# Patient Record
Sex: Male | Born: 1995 | Race: White | Hispanic: No | Marital: Single | Smoking: Former smoker
Health system: Southern US, Community
[De-identification: ages and names within clinical notes are randomized; demographics above are authoritative.]

## PROBLEM LIST (undated history)

## (undated) DIAGNOSIS — Z789 Other specified health status: Secondary | ICD-10-CM

## (undated) HISTORY — PX: NO PAST SURGERIES: SHX2092

## (undated) HISTORY — DX: Other specified health status: Z78.9

---

## 2018-12-28 ENCOUNTER — Encounter: Payer: Self-pay | Admitting: Sports Medicine

## 2018-12-28 ENCOUNTER — Ambulatory Visit (INDEPENDENT_AMBULATORY_CARE_PROVIDER_SITE_OTHER): Payer: BLUE CROSS/BLUE SHIELD

## 2018-12-28 ENCOUNTER — Other Ambulatory Visit: Payer: Self-pay

## 2018-12-28 ENCOUNTER — Ambulatory Visit (INDEPENDENT_AMBULATORY_CARE_PROVIDER_SITE_OTHER): Payer: BLUE CROSS/BLUE SHIELD | Admitting: Sports Medicine

## 2018-12-28 DIAGNOSIS — M25561 Pain in right knee: Secondary | ICD-10-CM | POA: Insufficient documentation

## 2018-12-28 DIAGNOSIS — M25562 Pain in left knee: Secondary | ICD-10-CM

## 2018-12-28 MED ORDER — MELOXICAM 15 MG PO TABS: ORAL_TABLET | ORAL | 3 refills | Status: DC

## 2018-12-28 NOTE — Assessment & Plan Note (Signed)
Benign exam, good hip abductor strength. X-rays, meloxicam, formal physical therapy. Return to see me in 4 to 6 weeks, MRI versus injection if no better.

## 2018-12-28 NOTE — Progress Notes (Signed)
Subjective:    CC: Left knee pain  HPI:  This is a pleasant 23 year old male, for the past few months he is had pain that he localizes in his left knee, anterior, worse when sitting for long periods of time, get stiff.  Does not really have much pain going Down stairs, no mechanical symptoms, no trauma, no swelling.  Symptoms are moderate, persistent, localized without radiation.  I reviewed the past medical history, family history, social history, surgical history, and allergies today and no changes were needed.  Please see the problem list section below in epic for further details.  Past Medical History: Past Medical History:  Diagnosis Date  . No pertinent past medical history    Past Surgical History: History reviewed. No pertinent surgical history. Social History: Social History   Socioeconomic History  . Marital status: Not on file    Spouse name: Not on file  . Number of children: Not on file  . Years of education: Not on file  . Highest education level: Not on file  Occupational History  . Not on file  Social Needs  . Financial resource strain: Not on file  . Food insecurity:    Worry: Not on file    Inability: Not on file  . Transportation needs:    Medical: Not on file    Non-medical: Not on file  Tobacco Use  . Smoking status: Former Games developer  . Smokeless tobacco: Never Used  Substance and Sexual Activity  . Alcohol use: Yes  . Drug use: Not on file  . Sexual activity: Not on file  Lifestyle  . Physical activity:    Days per week: Not on file    Minutes per session: Not on file  . Stress: Not on file  Relationships  . Social connections:    Talks on phone: Not on file    Gets together: Not on file    Attends religious service: Not on file    Active member of club or organization: Not on file    Attends meetings of clubs or organizations: Not on file    Relationship status: Not on file  Other Topics Concern  . Not on file  Social History Narrative   . Not on file   Family History: No family history on file. Allergies: No Known Allergies Medications: See med rec.  Review of Systems: No headache, visual changes, nausea, vomiting, diarrhea, constipation, dizziness, abdominal pain, skin rash, fevers, chills, night sweats, swollen lymph nodes, weight loss, chest pain, body aches, joint swelling, muscle aches, shortness of breath, mood changes, visual or auditory hallucinations.  Objective:    General: Well Developed, well nourished, and in no acute distress.  Neuro: Alert and oriented x3, extra-ocular muscles intact, sensation grossly intact.  HEENT: Normocephalic, atraumatic, pupils equal round reactive to light, neck supple, no masses, no lymphadenopathy, thyroid nonpalpable.  Skin: Warm and dry, no rashes noted.  Cardiac: Regular rate and rhythm, no murmurs rubs or gallops.  Respiratory: Clear to auscultation bilaterally. Not using accessory muscles, speaking in full sentences.  Abdominal: Soft, nontender, nondistended, positive bowel sounds, no masses, no organomegaly.  Left Knee: Normal to inspection with no erythema or effusion or obvious bony abnormalities. Palpation normal with no warmth or joint line tenderness or patellar tenderness or condyle tenderness. ROM normal in flexion and extension and lower leg rotation. Ligaments with solid consistent endpoints including ACL, PCL, LCL, MCL. Negative Mcmurray's and provocative meniscal tests. Non painful patellar compression. Patellar and quadriceps tendons  unremarkable. Hamstring and quadriceps strength is normal.  Impression and Recommendations:    The patient was counselled, risk factors were discussed, anticipatory guidance given.  Patellofemoral arthralgia of left knee Benign exam, good hip abductor strength. X-rays, meloxicam, formal physical therapy. Return to see me in 4 to 6 weeks, MRI versus injection if no better.   ___________________________________________  Ihor Austinhomas J. Benjamin Stainhekkekandam, M.D., ABFM., CAQSM. Primary Care and Sports Medicine [facility]  Adjunct Professor of Family Medicine  [employer]

## 2019-01-07 ENCOUNTER — Other Ambulatory Visit: Payer: Self-pay

## 2019-01-07 ENCOUNTER — Encounter: Payer: Self-pay | Admitting: Physical Therapy

## 2019-01-07 ENCOUNTER — Ambulatory Visit (INDEPENDENT_AMBULATORY_CARE_PROVIDER_SITE_OTHER): Payer: BLUE CROSS/BLUE SHIELD | Admitting: Physical Therapy

## 2019-01-07 DIAGNOSIS — M25562 Pain in left knee: Secondary | ICD-10-CM | POA: Diagnosis not present

## 2019-01-07 DIAGNOSIS — R29898 Other symptoms and signs involving the musculoskeletal system: Secondary | ICD-10-CM | POA: Diagnosis not present

## 2019-01-07 NOTE — Patient Instructions (Addendum)
IONTOPHORESIS PATIENT PRECAUTIONS & CONTRAINDICATIONS:  . Redness under one or both electrodes can occur.  This characterized by a uniform redness that usually disappears within 12 hours of treatment. . Small pinhead size blisters may result in response to the drug.  Contact your physician if the problem persists more than 24 hours. . On rare occasions, iontophoresis therapy can result in temporary skin reactions such as rash, inflammation, irritation or burns.  The skin reactions may be the result of individual sensitivity to the ionic solution used, the condition of the skin at the start of treatment, reaction to the materials in the electrodes, allergies or sensitivity to dexamethasone, or a poor connection between the patch and your skin.  Discontinue using iontophoresis if you have any of these reactions and report to your therapist. . Remove the Patch or electrodes if you have any undue sensation of pain or burning during the treatment and report discomfort to your therapist. . Tell your Therapist if you have had known adverse reactions to the application of electrical current. . If using the Patch, the LED light will turn off when treatment is complete and the patch can be removed.  Approximate treatment time is 1-3 hours.  Remove the patch when light goes off or after 6 hours. . The Patch can be worn during normal activity, however excessive motion where the electrodes have been placed can cause poor contact between the skin and the electrode or uneven electrical current resulting in greater risk of skin irritation. Marland Kitchen Keep out of the reach of children.   . DO NOT use if you have a cardiac pacemaker or any other electrically sensitive implanted device. . DO NOT use if you have a known sensitivity to dexamethasone. . DO NOT use during Magnetic Resonance Imaging (MRI). . DO NOT use over broken or compromised skin (e.g. sunburn, cuts, or acne) due to the increased risk of skin reaction. . DO  NOT SHAVE over the area to be treated:  To establish good contact between the Patch and the skin, excessive hair may be clipped. . DO NOT place the Patch or electrodes on or over your eyes, directly over your heart, or brain. . DO NOT reuse the Patch or electrodes as this may cause burns to occur.    Access Code: [email or web address]  URL: [email or web address]  Date: 01/07/2019  Prepared by: Moshe Cipro   Exercises  Straight Leg Raise with External Rotation - 10 reps - 1 sets - 2-3 sec hold - 2x daily - 7x weekly  Sidelying Hip Abduction - 1 sets - 10 reps - 5-10 sec hold - 2x daily - 7x weekly  Prone Quadriceps Stretch with Strap - 3 reps - 3 sets - 30 sec hold - 2x daily - 7x weekly  Seated Hamstring Stretch - 3 reps - 1 sets - 30 sec hold - 2x daily - 7x weekly

## 2019-01-07 NOTE — Therapy (Signed)
[facility] [address] [place], [place], [address] Phone: [phone]   Fax:  [phone]  Physical Therapy Evaluation  Patient Details  Name: Jose Peterson MRN: [id number] Date of Birth: 01/24/1996 Referring Provider (PT): Monica Becton, MD   Encounter Date: 01/07/2019  PT End of Session - 01/07/19 1222    Visit Number  1    Number of Visits  6    Date for PT Re-Evaluation  02/18/19    PT Start Time  0900    PT Stop Time  0943    PT Time Calculation (min)  43 min    Activity Tolerance  Patient tolerated treatment well    Behavior During Therapy  [facility] for tasks assessed/performed       Past Medical History:  Diagnosis Date  . No pertinent past medical history     Past Surgical History:  Procedure Laterality Date  . NO PAST SURGERIES      There were no vitals filed for this visit.   Subjective Assessment - 01/07/19 0905    Subjective  Pt is a 23 y/o male who presents to OPPT for Lt knee pain, with increase in symptoms in Rt knee.  Pt reports diagnosed with "runners knee" and attributes pain to increased squatting.  Pt reports ~ 1 month ago walked on beach for ~ 3 hours and began to develop Lt knee pain.  Reports occasional episodes of "locking" after sitting for long periods.    Limitations  Walking;Standing    How long can you stand comfortably?  few hours    How long can you walk comfortably?  limiting at the moment    Diagnostic tests  xrays: negative    Patient Stated Goals  improve pain    Currently in Pain?  Yes    Pain Score  0-No pain   up to 7/10   Pain Location  Knee    Pain Orientation  Left;Anterior;Medial;Posterior    Pain Descriptors / Indicators  Sore    Pain Type  Chronic pain    Pain Onset  More than a month ago    Pain Frequency  Intermittent    Aggravating Factors   standing, walking, repetitive force    Pain Relieving Factors  medication, stretches         [facility] PT  Assessment - 01/07/19 0911      Assessment   Medical Diagnosis  Patellofemoral arthralgia of left knee    Referring Provider (PT)  Monica Becton, MD    Onset Date/Surgical Date  --   chronic x 1 year   Next MD Visit  PRN    Prior Therapy  previously in college      Precautions   Precautions  None      Restrictions   Weight Bearing Restrictions  No      Balance Screen   Has the patient fallen in the past 6 months  No    Has the patient had a decrease in activity level because of a fear of falling?   No    Is the patient reluctant to leave their home because of a fear of falling?   No      Home Environment   Living Environment  Private residence    Living Arrangements  Parent    Type of Home  House    Home Access  Stairs to enter    Entrance Stairs-Number of Steps  4  Entrance Stairs-Rails  None    Home Layout  One level      Prior Function   Level of Independence  Independent    Vocation  Part time employment    Doctor, general practiceVocation Requirements  sound engineer; moving lights/mic stands, standing; variable responsibilities lifting up to hundreds of pounds with assistance    Leisure  make music, drums; had started to return to running      Cognition   Overall Cognitive Status  Within Functional Limits for tasks assessed      Observation/Other Assessments   Focus on Therapeutic Outcomes (FOTO)   83 (17% limited; predicted 11% limited)      ROM / Strength   AROM / PROM / Strength  AROM;Strength      AROM   Overall AROM Comments  bil knees WNL      Strength   Overall Strength Comments  poor knee stability with testing    Strength Assessment Site  Hip;Knee;Ankle    Right/Left Hip  Right;Left    Right Hip Flexion  5/5    Right Hip Extension  5/5    Right Hip ABduction  4+/5    Right Hip ADduction  4+/5    Left Hip Flexion  5/5    Left Hip Extension  5/5    Left Hip ABduction  4/5    Left Hip ADduction  4/5    Right/Left Knee  Right;Left    Right Knee Flexion  5/5     Right Knee Extension  5/5    Left Knee Flexion  4/5    Left Knee Extension  5/5    Right/Left Ankle  Right;Left    Right Ankle Dorsiflexion  5/5    Left Ankle Dorsiflexion  5/5      Flexibility   Soft Tissue Assessment /Muscle Length  yes    Hamstrings  tightness bil      Palpation   Palpation comment  tenderness along bil medial joint line Lt > Rt; mild tenderness noted in gracilis      Special Tests    Special Tests  Meniscus Tests    Meniscus Tests  McMurray Test      McMurray Test   Comments  no pain, clunk noted at ~ 45 degrees hip flexion                Objective measurements completed on examination: See above findings.      [facility] Adult PT Treatment/Exercise - 01/07/19 1218      Exercises   Exercises  Knee/Hip      Knee/Hip Exercises: Stretches   Passive Hamstring Stretch Limitations  advised to continue from HEP provided by MD    Quad Stretch Limitations  advised to continue from HEP provided by MD      Knee/Hip Exercises: Supine   Straight Leg Raise with External Rotation  Left   instructed in HEP     Knee/Hip Exercises: Sidelying   Hip ABduction  Left    Hip ABduction Limitations  instructed in HEP      Modalities   Modalities  Iontophoresis      Iontophoresis   Type of Iontophoresis  Dexamethasone    Location  Lt medial joint line, knee    Dose  1.0 cc    Time  6 hour patch             PT Education - 01/07/19 1222    Education Details  ionto, HEP    Person(s)  Educated  Patient    Methods  Explanation;Demonstration;Handout    Comprehension  Verbalized understanding;Returned demonstration;Need further instruction          PT Long Term Goals - 01/07/19 1227      PT LONG TERM GOAL #1   Title  independent with HEP    Status  New    Target Date  02/18/19      PT LONG TERM GOAL #2   Title  FOTO score improved to </= 11% for improved function    Status  New    Target Date  02/18/19      PT LONG TERM GOAL #3   Title   report ability to run/walk at least 2 miles without pain    Status  New    Target Date  02/18/19      PT LONG TERM GOAL #4   Title  demonstrate 5/5 LLE strength    Status  New    Target Date  02/18/19             Plan - 01/07/19 0957    Clinical Impression Statement  Pt is a 23 y/o male who presents to OPPT for Lt knee pain.  Pt demonstrates mild strength limitations and tenderness along adductors with mild lateral tracking of patella.  Pt will benefit from PT to address deficits listed.    Examination-Activity Limitations  Locomotion Level    Examination-Participation Restrictions  Community Activity;Other   work   Stability/Clinical Decision Making  Stable/Uncomplicated    Clinical Decision Making  Low    Rehab Potential  Good    PT Frequency  1x / week    PT Duration  6 weeks    PT Treatment/Interventions  ADLs/Self Care Home Management;Cryotherapy;Ultrasound;Moist Heat;Iontophoresis /ml Dexamethasone;Electrical Stimulation;Gait training;Functional mobility training;Stair training;Neuromuscular re-education;Therapeutic exercise;Therapeutic activities;Patient/family education;Manual techniques;Dry needling;Taping;Vasopneumatic Device    PT Next Visit Plan  review HEP, continue knee stability    PT Home Exercise Plan  Access Code: [email or web address]     Consulted and Agree with Plan of Care  Patient       Patient will benefit from skilled therapeutic intervention in order to improve the following deficits and impairments:  Increased fascial restricitons, Increased muscle spasms, Pain, Decreased strength, Impaired flexibility, Difficulty walking  Visit Diagnosis: Acute pain of left knee - Plan: PT plan of care cert/re-cert  Other symptoms and signs involving the musculoskeletal system - Plan: PT plan of care cert/re-cert     Problem List Patient Active Problem List   Diagnosis Date Noted  . Patellofemoral arthralgia of left knee 12/28/2018      Clarita Crane, PT,  DPT 01/07/19 12:33 PM     [facility] [address] [place], [place], [address] Phone: [phone]   Fax:  [phone]  Name: Jose Peterson MRN: [id number] Date of Birth: [birth year]

## 2019-01-14 ENCOUNTER — Encounter: Payer: Self-pay | Admitting: Physical Therapy

## 2019-01-14 ENCOUNTER — Other Ambulatory Visit: Payer: Self-pay

## 2019-01-14 ENCOUNTER — Ambulatory Visit (INDEPENDENT_AMBULATORY_CARE_PROVIDER_SITE_OTHER): Payer: BLUE CROSS/BLUE SHIELD | Admitting: Physical Therapy

## 2019-01-14 DIAGNOSIS — M25562 Pain in left knee: Secondary | ICD-10-CM

## 2019-01-14 DIAGNOSIS — R29898 Other symptoms and signs involving the musculoskeletal system: Secondary | ICD-10-CM

## 2019-01-14 NOTE — Therapy (Signed)
[place], [place], [address] Phone: [phone]   Fax:  [phone]  Physical Therapy Evaluation  Patient Details  Name: Marquee Fuchs MRN: [id number] Date of Birth: 11-02-1995 Referring Provider (PT): Silverio Decamp, MD   Encounter Date: 01/14/2019  PT End of Session - 01/14/19 0940    Visit Number  2    Number of Visits  6    Date for PT Re-Evaluation  02/18/19    PT Start Time  0900    PT Stop Time  0930    PT Time Calculation (min)  30 min    Activity Tolerance  Patient tolerated treatment well    Behavior During Therapy  [facility] for tasks assessed/performed       Past Medical History:  Diagnosis Date  . No pertinent past medical history     Past Surgical History:  Procedure Laterality Date  . NO PAST SURGERIES      There were no vitals filed for this visit.   Subjective Assessment - 01/14/19 0901    Subjective  doing well, "the knee has actually been feeling really good."  did a 2 mile walk and had no difficulty.    Patient Stated Goals  improve pain    Currently in Pain?  Yes    Pain Score  0-No pain                    Objective measurements completed on examination: See above findings.      [facility] Adult PT Treatment/Exercise - 01/14/19 0903      Knee/Hip Exercises: Stretches   Passive Hamstring Stretch  Left;3 reps;30 seconds    Passive Hamstring Stretch Limitations  seated    Quad Stretch  Left;3 reps;30 seconds    Quad Stretch Limitations  sidelying      Knee/Hip Exercises: Aerobic   Elliptical  L2 x 5 min      Knee/Hip Exercises: Standing   Wall Squat  10 reps;10 seconds   with Rt heel lift for increased LLE weight bearing   Lunge Walking - Round Trips  forward and diagonal lunge x 10 reps each bil with min cues for posture and technique      Knee/Hip Exercises: Seated   Long Arc Quad  Left;10 reps    Long Arc Quad Limitations  with ball  squeeze and 5 sec hold      Knee/Hip Exercises: Supine   Bridges  10 reps    Bridges Limitations  with knee extension; cues to decrease hip drop    Straight Leg Raise with External Rotation  Left;10 reps      Knee/Hip Exercises: Sidelying   Hip ABduction  Left;10 reps    Hip ABduction Limitations  with end range pulse x 10 sec             PT Education - 01/14/19 0940    Education Details  return to running    Person(s) Educated  Patient    Methods  Explanation;Demonstration;Handout    Comprehension  Verbalized understanding;Returned demonstration;Need further instruction          PT Long Term Goals - 01/07/19 1227      PT LONG TERM GOAL #1   Title  independent with HEP    Status  New    Target Date  02/18/19      PT LONG TERM GOAL #2   Title  FOTO score improved to </= 11% for  improved function    Status  New    Target Date  02/18/19      PT LONG TERM GOAL #3   Title  report ability to run/walk at least 2 miles without pain    Status  New    Target Date  02/18/19      PT LONG TERM GOAL #4   Title  demonstrate 5/5 LLE strength    Status  New    Target Date  02/18/19             Plan - 01/14/19 0940    Clinical Impression Statement  Pt overall reports no pain since initial eval and tolerated session well today.  Advised to gradually try returning to running and see how knee responds.  No goals met as only 2nd visit, but progressing well at this time.    PT Treatment/Interventions  ADLs/Self Care Home Management;Cryotherapy;Ultrasound;Moist Heat;Iontophoresis '4mg'$ /ml Dexamethasone;Electrical Stimulation;Gait training;Functional mobility training;Stair training;Neuromuscular re-education;Therapeutic exercise;Therapeutic activities;Patient/family education;Manual techniques;Dry needling;Taping;Vasopneumatic Device    PT Next Visit Plan  continue knee stability    PT Home Exercise Plan  Access Code: [email or web address]     Consulted and Agree with Plan of Care  Patient        Patient will benefit from skilled therapeutic intervention in order to improve the following deficits and impairments:  Increased fascial restricitons, Increased muscle spasms, Pain, Decreased strength, Impaired flexibility, Difficulty walking  Visit Diagnosis: Acute pain of left knee  Other symptoms and signs involving the musculoskeletal system     Problem List Patient Active Problem List   Diagnosis Date Noted  . Patellofemoral arthralgia of left knee 12/28/2018      Laureen Abrahams, PT, DPT 01/14/19 9:42 AM     [facility] [place], [place], [address] Phone: [phone]   Fax:  [phone]  Name: Nakia Koble MRN: [id number] Date of Birth: 06/01/1996

## 2019-01-14 NOTE — Patient Instructions (Signed)
Access Code: [email or web address]  URL: [email or web address]  Date: 01/14/2019  Prepared by: Moshe Cipro   Exercises  Straight Leg Raise with External Rotation - 10 reps - 1 sets - 2-3 sec hold - 2x daily - 7x weekly  Sidelying Hip Abduction - 1 sets - 10 reps - 5-10 sec hold - 2x daily - 7x weekly  Prone Quadriceps Stretch with Strap - 3 reps - 3 sets - 30 sec hold - 2x daily - 7x weekly  Seated Hamstring Stretch - 3 reps - 1 sets - 30 sec hold - 2x daily - 7x weekly  Supine Bridge with Knee Extension and Pelvic Floor Contraction - 10 reps - 1 sets - 5 sec hold - 1x daily - 7x weekly  Seated Long Arc Quad - 10 reps - 1 sets - 5 sec hold - 1x daily - 7x weekly  Wall Squat with Leg Lifts - 10 reps - 1 sets - 10 sec hold - 1x daily - 7x weekly  Standard Lunge - 10 reps - 1 sets - 1x daily - 7x weekly  Diagonal Lunge - 10 reps - 1 sets - 1x daily - 7x weekly

## 2019-01-21 ENCOUNTER — Ambulatory Visit (INDEPENDENT_AMBULATORY_CARE_PROVIDER_SITE_OTHER): Payer: BLUE CROSS/BLUE SHIELD | Admitting: Physical Therapy

## 2019-01-21 ENCOUNTER — Encounter: Payer: Self-pay | Admitting: Physical Therapy

## 2019-01-21 ENCOUNTER — Other Ambulatory Visit: Payer: Self-pay

## 2019-01-21 DIAGNOSIS — R29898 Other symptoms and signs involving the musculoskeletal system: Secondary | ICD-10-CM | POA: Diagnosis not present

## 2019-01-21 DIAGNOSIS — M25562 Pain in left knee: Secondary | ICD-10-CM | POA: Diagnosis not present

## 2019-01-21 NOTE — Therapy (Addendum)
[facility] [place] [place], [place], [address] Phone: [phone]   Fax:  [phone]  Physical Therapy Treatment/Discharge  Patient Details  Name: Jose Peterson MRN: [id number] Date of Birth: 1995/09/17 Referring Provider (PT): Silverio Decamp, MD   Encounter Date: 01/21/2019  PT End of Session - 01/21/19 0948    Visit Number  3    Number of Visits  6    Date for PT Re-Evaluation  02/18/19    PT Start Time  0859    PT Stop Time  0940    PT Time Calculation (min)  41 min    Activity Tolerance  Patient tolerated treatment well    Behavior During Therapy  [facility] for tasks assessed/performed       Past Medical History:  Diagnosis Date  . No pertinent past medical history     Past Surgical History:  Procedure Laterality Date  . NO PAST SURGERIES      There were no vitals filed for this visit.  Subjective Assessment - 01/21/19 0858    Subjective  tried to run last week and noticed pain a couple days later.  same pain but less intense, didn't do stretches during the week though.    Patient Stated Goals  improve pain    Currently in Pain?  No/denies                       [facility] Adult PT Treatment/Exercise - 01/21/19 0902      Knee/Hip Exercises: Stretches   Passive Hamstring Stretch  Left;3 reps;30 seconds    Passive Hamstring Stretch Limitations  supine with strap    Quad Stretch  Left;3 reps;30 seconds    Quad Stretch Limitations  sidelying    Other Knee/Hip Stretches  adductor stretch Lt 3x30 sec; supine with strap      Knee/Hip Exercises: Aerobic   Elliptical  L3 x 5 min      Knee/Hip Exercises: Machines for Strengthening   Cybex Leg Press  LLE 2x10; 9 plates      Knee/Hip Exercises: Standing   Lunge Walking - Round Trips  20' x 2 forward/backward; laterally    SLS  LLE mini squat off 6" step x 20; backwards and laterally    SLS with Vectors  LLE squat 4 directions x 5 reps  each      Iontophoresis   Type of Iontophoresis  Dexamethasone    Location  Lt medial joint line, knee    Dose  1.0 cc    Time  6 hour patch                  PT Long Term Goals - 01/07/19 1227      PT LONG TERM GOAL #1   Title  independent with HEP    Status  New    Target Date  02/18/19      PT LONG TERM GOAL #2   Title  FOTO score improved to </= 11% for improved function    Status  New    Target Date  02/18/19      PT LONG TERM GOAL #3   Title  report ability to run/walk at least 2 miles without pain    Status  New    Target Date  02/18/19      PT LONG TERM GOAL #4   Title  demonstrate 5/5 LLE strength    Status  New  Target Date  02/18/19            Plan - 01/21/19 0948    Clinical Impression Statement  Pt reports return to running this past week with slight return of pain (decreased from initial eval), and states he was not consistent with stretching and exercises this week.  Recommended more consistency with HEP and try running again to see how knee responds.  LLE instabilty noted with SLS activities needing cues to maintain knee alignment.  Pt will continue to benefit from PT to maximize function.      PT Treatment/Interventions  ADLs/Self Care Home Management;Cryotherapy;Ultrasound;Moist Heat;Iontophoresis '4mg'$ /ml Dexamethasone;Electrical Stimulation;Gait training;Functional mobility training;Stair training;Neuromuscular re-education;Therapeutic exercise;Therapeutic activities;Patient/family education;Manual techniques;Dry needling;Taping;Vasopneumatic Device    PT Next Visit Plan  continue knee stability    PT Home Exercise Plan  Access Code: [email or web address]     Consulted and Agree with Plan of Care  Patient       Patient will benefit from skilled therapeutic intervention in order to improve the following deficits and impairments:  Increased fascial restricitons, Increased muscle spasms, Pain, Decreased strength, Impaired flexibility, Difficulty  walking  Visit Diagnosis: Acute pain of left knee  Other symptoms and signs involving the musculoskeletal system     Problem List Patient Active Problem List   Diagnosis Date Noted  . Patellofemoral arthralgia of left knee 12/28/2018      Laureen Abrahams, PT, DPT 01/21/19 9:50 AM     [facility] [address] [place], [place], [address] Phone: [phone]   Fax:  [phone]  Name: Jose Peterson MRN: [id number] Date of Birth: 06/07/1996      PHYSICAL THERAPY DISCHARGE SUMMARY  Visits from Start of Care: 3  Current functional level related to goals / functional outcomes: See above   Remaining deficits: Unknown, anticipate no deficits as pt canceled due to doing well   Education / Equipment: HEP  Plan: Patient agrees to discharge.  Patient goals were not met. Patient is being discharged due to being pleased with the current functional level.  ?????    Laureen Abrahams, PT, DPT 02/21/19 1:14 PM  [facility] [address] [place], [place] [address]  [phone] (office) [phone] (fax)

## 2019-01-27 ENCOUNTER — Encounter: Payer: Self-pay | Admitting: Physical Therapy

## 2019-02-21 DIAGNOSIS — H6503 Acute serous otitis media, bilateral: Secondary | ICD-10-CM | POA: Diagnosis not present

## 2019-02-21 DIAGNOSIS — H6123 Impacted cerumen, bilateral: Secondary | ICD-10-CM | POA: Diagnosis not present

## 2019-04-19 ENCOUNTER — Other Ambulatory Visit: Payer: Self-pay | Admitting: Sports Medicine

## 2019-04-19 DIAGNOSIS — M25562 Pain in left knee: Secondary | ICD-10-CM

## 2019-06-07 DIAGNOSIS — F321 Major depressive disorder, single episode, moderate: Secondary | ICD-10-CM | POA: Diagnosis not present

## 2019-07-05 DIAGNOSIS — F321 Major depressive disorder, single episode, moderate: Secondary | ICD-10-CM | POA: Diagnosis not present

## 2021-01-03 ENCOUNTER — Other Ambulatory Visit: Payer: Self-pay

## 2021-01-03 ENCOUNTER — Ambulatory Visit (INDEPENDENT_AMBULATORY_CARE_PROVIDER_SITE_OTHER): Payer: BLUE CROSS/BLUE SHIELD

## 2021-01-03 ENCOUNTER — Ambulatory Visit (INDEPENDENT_AMBULATORY_CARE_PROVIDER_SITE_OTHER): Payer: BLUE CROSS/BLUE SHIELD | Admitting: Sports Medicine

## 2021-01-03 DIAGNOSIS — M76891 Other specified enthesopathies of right lower limb, excluding foot: Secondary | ICD-10-CM | POA: Diagnosis not present

## 2021-01-03 DIAGNOSIS — M25561 Pain in right knee: Secondary | ICD-10-CM | POA: Diagnosis not present

## 2021-01-03 DIAGNOSIS — M79672 Pain in left foot: Secondary | ICD-10-CM

## 2021-01-03 DIAGNOSIS — G8929 Other chronic pain: Secondary | ICD-10-CM

## 2021-01-03 DIAGNOSIS — M25761 Osteophyte, right knee: Secondary | ICD-10-CM | POA: Diagnosis not present

## 2021-01-03 DIAGNOSIS — M25562 Pain in left knee: Secondary | ICD-10-CM

## 2021-01-03 MED ORDER — MELOXICAM 15 MG PO TABS: ORAL_TABLET | ORAL | 3 refills | Status: DC

## 2021-01-03 MED ORDER — PREDNISONE 50 MG PO TABS: ORAL_TABLET | ORAL | 0 refills | Status: AC

## 2021-01-03 NOTE — Assessment & Plan Note (Signed)
Bilateral patellofemoral type pain, as [place] 800 miles [place] of 2000. 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 He has had some suprapatellar swelling. Improved to some degree with using leftover meloxicam. We will do a 5-day burst of prednisone followed by restarting meloxicam, reaction knee brace on the right, he will wear a knee sleeve on the left and can switch them up as needed. He tends to ice his knees in a cold river occasionally. We can do a virtual visit at some point in a month.

## 2021-01-03 NOTE — Assessment & Plan Note (Signed)
Currently [place] the [place], on [place] 800, started to have some pain on the plantar aspect of his left foot mid fifth metatarsal shaft. Improved with switching to a more cushioned boot rather than a zero-drop. Adding some x-rays. I did add some lateral posting heel wedges that he can use as needed should his pain return.

## 2021-01-03 NOTE — Progress Notes (Signed)
    Procedures performed today:    None.  Independent interpretation of notes and tests performed by another provider:   None.  Brief History, Exam, Impression, and Recommendations:    Left foot pain Currently hiking the [place], on [place] 800, started to have some pain on the plantar aspect of his left foot mid fifth metatarsal shaft. Improved with switching to a more cushioned boot rather than a zero-drop. Adding some x-rays. I did add some lateral posting heel wedges that he can use as needed should his pain return.  Bilateral knee pain Bilateral patellofemoral type pain, as below [place], [place] 800 miles in out of 2000. 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 He has had some suprapatellar swelling. Improved to some degree with using leftover meloxicam. We will do a 5-day burst of prednisone followed by restarting meloxicam, reaction knee brace on the right, he will wear a knee sleeve on the left and can switch them up as needed. He tends to ice his knees in a cold river occasionally. We can do a virtual visit at some point in a month.    ___________________________________________ Ihor Austin. Benjamin Stain, M.D., ABFM., CAQSM. Primary Care and Sports Medicine [facility]  Adjunct Instructor of Family Medicine  [employer]

## 2021-01-31 ENCOUNTER — Telehealth (INDEPENDENT_AMBULATORY_CARE_PROVIDER_SITE_OTHER): Payer: BLUE CROSS/BLUE SHIELD | Admitting: Sports Medicine

## 2021-01-31 DIAGNOSIS — M25552 Pain in left hip: Secondary | ICD-10-CM

## 2021-01-31 DIAGNOSIS — M25561 Pain in right knee: Secondary | ICD-10-CM

## 2021-01-31 DIAGNOSIS — G8929 Other chronic pain: Secondary | ICD-10-CM

## 2021-01-31 DIAGNOSIS — M25562 Pain in left knee: Secondary | ICD-10-CM

## 2021-01-31 DIAGNOSIS — M79672 Pain in left foot: Secondary | ICD-10-CM | POA: Diagnosis not present

## 2021-01-31 NOTE — Assessment & Plan Note (Signed)
Hudson also had some pain on the plantar aspect of his left foot mid fifth metatarsal, this improved with switching from a 0 drop shoe to a more cushioned boot, I added some lateral posting heel wedges and he is for the most part pain-free now.

## 2021-01-31 NOTE — Progress Notes (Signed)
Virtual Visit via WebEx/MyChart   I connected with  Jose Peterson  on 01/31/21 via WebEx/MyChart/Doximity Video and verified that I am speaking with the correct person using two identifiers.   I discussed the limitations, risks, security and privacy concerns of performing an evaluation and management service by WebEx/MyChart/Doximity Video, including the higher likelihood of inaccurate diagnosis and treatment, and the availability of in person appointments.  We also discussed the likely need of an additional face to face encounter for complete and high quality delivery of care.  I also discussed with the patient that there may be a patient responsible charge related to this service. The patient expressed understanding and wishes to proceed.  Provider location is in medical facility. Patient location is at their home, different from provider location. People involved in care of the patient during this telehealth encounter were myself, my nurse/medical assistant, and my front office/scheduling team member.  Review of Systems: No fevers, chills, night sweats, weight loss, chest pain, or shortness of breath.   Objective Findings:    General: Speaking full sentences, no audible heavy breathing.  Sounds alert and appropriately interactive.  Appears well.  Face symmetric.  Extraocular movements intact.  Pupils equal and round.  No nasal flaring or accessory muscle use visualized.  Independent interpretation of tests performed by another provider:   None.  Brief History, Exam, Impression, and Recommendations:    Bilateral knee pain Jose Peterson saw me with some patellofemoral type pain approximately a month ago, he was about 800 miles into a 2000 mile hike through the [place] trail, he did have some suprapatellar swelling, we did a 5-day burst of prednisone, followed by meloxicam, reaction knee brace, he has done extremely well, no pain, no limitations, he does not need the meloxicam anymore and he  is not needing the brace very often, we can follow this conservatively.  Left foot pain Jose Peterson also had some pain on the plantar aspect of his left foot mid fifth metatarsal, this improved with switching from a 0 drop shoe to a more cushioned boot, I added some lateral posting heel wedges and he is for the most part pain-free now.  Posterior pain of left hip Jose Peterson is now complaining of some pain in the left posterior hip, tough to see exactly where he is pointing on the video call as it was quite pixelated however I think he is having piriformis type pain without any sciatic symptoms. I will go ahead and email him the piriformis rehab exercises so he can do them on the Trail. He can follow this up with me in a month or so if needed.   I discussed the above assessment and treatment plan with the patient. The patient was provided an opportunity to ask questions and all were answered. The patient agreed with the plan and demonstrated an understanding of the instructions.   The patient was advised to call back or seek an in-person evaluation if the symptoms worsen or if the condition fails to improve as anticipated.   I provided 30 minutes of face to face and non-face-to-face time during this encounter date, time was needed to gather information, review chart, records, communicate/coordinate with staff remotely, as well as complete documentation.  Specifically we talked about care during his wilderness track, as well as the pathophysiology and conditioning for piriformis syndrome.   ___________________________________________ Ihor Austin. Benjamin Stain, M.D., ABFM., CAQSM. Primary Care and Sports Medicine [facility]  Adjunct Instructor of Family Medicine  [employer]  [employer]

## 2021-01-31 NOTE — Assessment & Plan Note (Signed)
Jose Peterson saw me with some patellofemoral type pain approximately a month ago, he was about 800 miles into a 2000 mile hike through the [place] trail, he did have some suprapatellar swelling, we did a 5-day burst of prednisone, followed by meloxicam, reaction knee brace, he has done extremely well, no pain, no limitations, he does not need the meloxicam anymore and he is not needing the brace very often, we can follow this conservatively.

## 2021-01-31 NOTE — Assessment & Plan Note (Signed)
Hudson is now complaining of some pain in the left posterior hip, tough to see exactly where he is pointing on the video call as it was quite pixelated however I think he is having piriformis type pain without any sciatic symptoms. I will go ahead and email him the piriformis rehab exercises so he can do them on the Trail. He can follow this up with me in a month or so if needed.

## 2021-02-11 ENCOUNTER — Telehealth (INDEPENDENT_AMBULATORY_CARE_PROVIDER_SITE_OTHER): Payer: BLUE CROSS/BLUE SHIELD

## 2021-02-11 DIAGNOSIS — L0889 Other specified local infections of the skin and subcutaneous tissue: Secondary | ICD-10-CM

## 2021-02-11 MED ORDER — ALUMINUM CHLORIDE 20 % EX SOLN: CUTANEOUS | 3 refills | Status: DC

## 2021-02-11 MED ORDER — DOXYCYCLINE HYCLATE 100 MG PO TABS: 100.0000 mg | ORAL_TABLET | Freq: Two times a day (BID) | ORAL | 0 refills | Status: DC

## 2021-02-11 MED ORDER — CLOTRIMAZOLE-BETAMETHASONE 1-0.05 % EX CREA: 1.0000 "application " | TOPICAL_CREAM | Freq: Two times a day (BID) | CUTANEOUS | 0 refills | Status: DC

## 2021-02-11 NOTE — Assessment & Plan Note (Signed)
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 Adding doxycycline, topical Lotrisone, Drysol, and I have advised him to take a couple of days off from hiking. He should follow this up with me in about a week and a virtual visit.  Pictures of the condition are high-quality and available in his MyChart message.

## 2021-02-11 NOTE — Telephone Encounter (Signed)
Patient called from trail to report that his feet are red and tender. They look white and like they have been in water for some time. Ask patient if he was changing his socks as often as possible and keeping them dry as possible on the trail. He confirmed. He stated that he is having some trouble continuing on the trail as his feet are very tender. He would like some cream, etc. Called in to [facility] in [facility] [place] [facility] [employer] Pharmacy found and loaded into patient's chart.

## 2021-02-11 NOTE — Telephone Encounter (Signed)
I spent 5 total minutes of online digital evaluation and management services. 

## 2021-02-25 MED ORDER — DOXYCYCLINE HYCLATE 100 MG PO TABS: 100.0000 mg | ORAL_TABLET | Freq: Two times a day (BID) | ORAL | 0 refills | Status: AC

## 2021-02-25 MED ORDER — CLOTRIMAZOLE-BETAMETHASONE 1-0.05 % EX CREA: 1.0000 "application " | TOPICAL_CREAM | Freq: Two times a day (BID) | CUTANEOUS | 0 refills | Status: AC

## 2021-02-25 MED ORDER — ALUMINUM CHLORIDE 20 % EX SOLN: CUTANEOUS | 3 refills | Status: AC

## 2021-02-25 NOTE — Telephone Encounter (Signed)
I spent 5 total minutes of online digital evaluation and management services. 

## 2021-02-25 NOTE — Addendum Note (Signed)
Addended by: Monica Becton on: 02/25/2021 05:36 PM   Modules accepted: Orders

## 2021-04-01 ENCOUNTER — Other Ambulatory Visit: Payer: Self-pay | Admitting: Sports Medicine

## 2021-04-01 DIAGNOSIS — G8929 Other chronic pain: Secondary | ICD-10-CM

## 2021-10-21 IMAGING — DX DG KNEE COMPLETE 4+V*R*
4 series · 4 of 4 positions shown · non-contrast
Comparison: None.

CLINICAL DATA: Pain

EXAM:
RIGHT KNEE - COMPLETE 4+ VIEW

[tunnel]
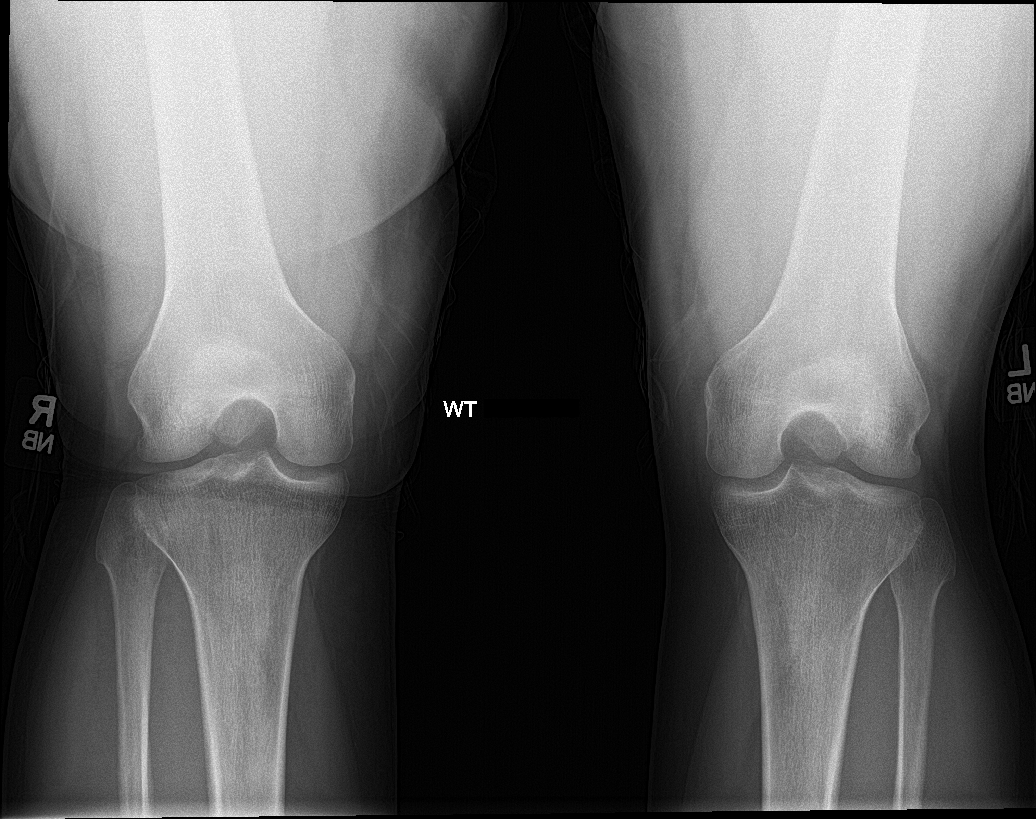

[knee lat]
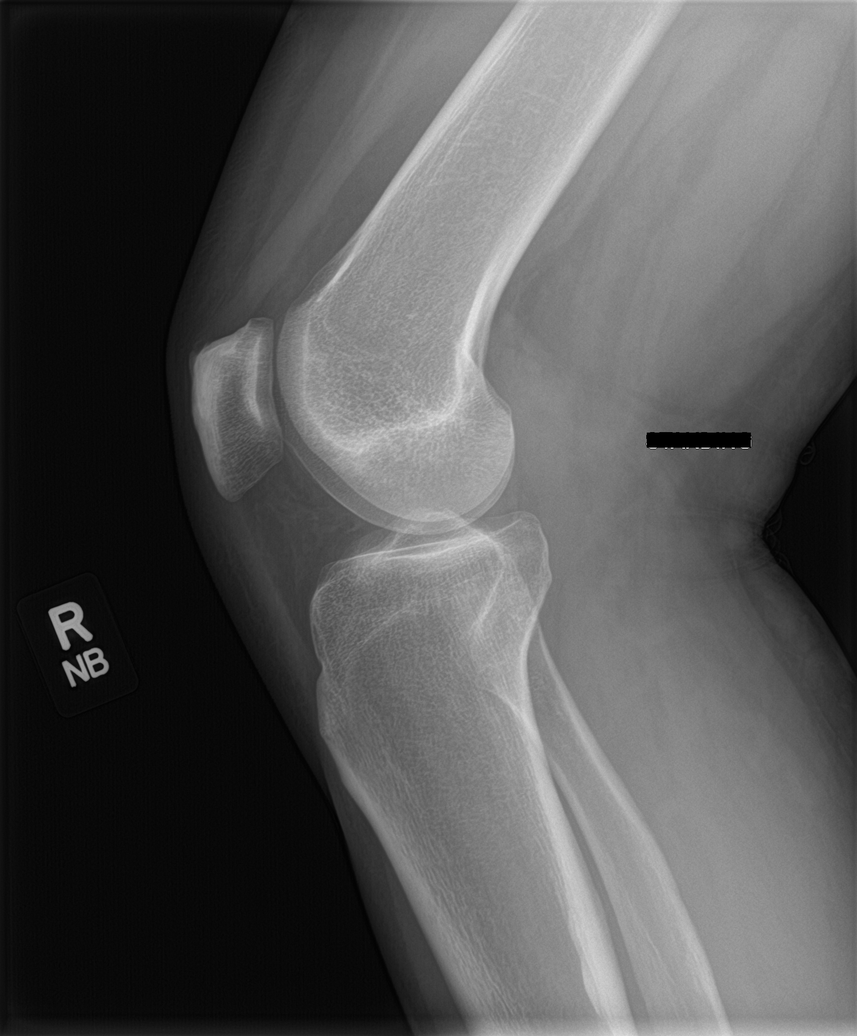

[knee sunrise]
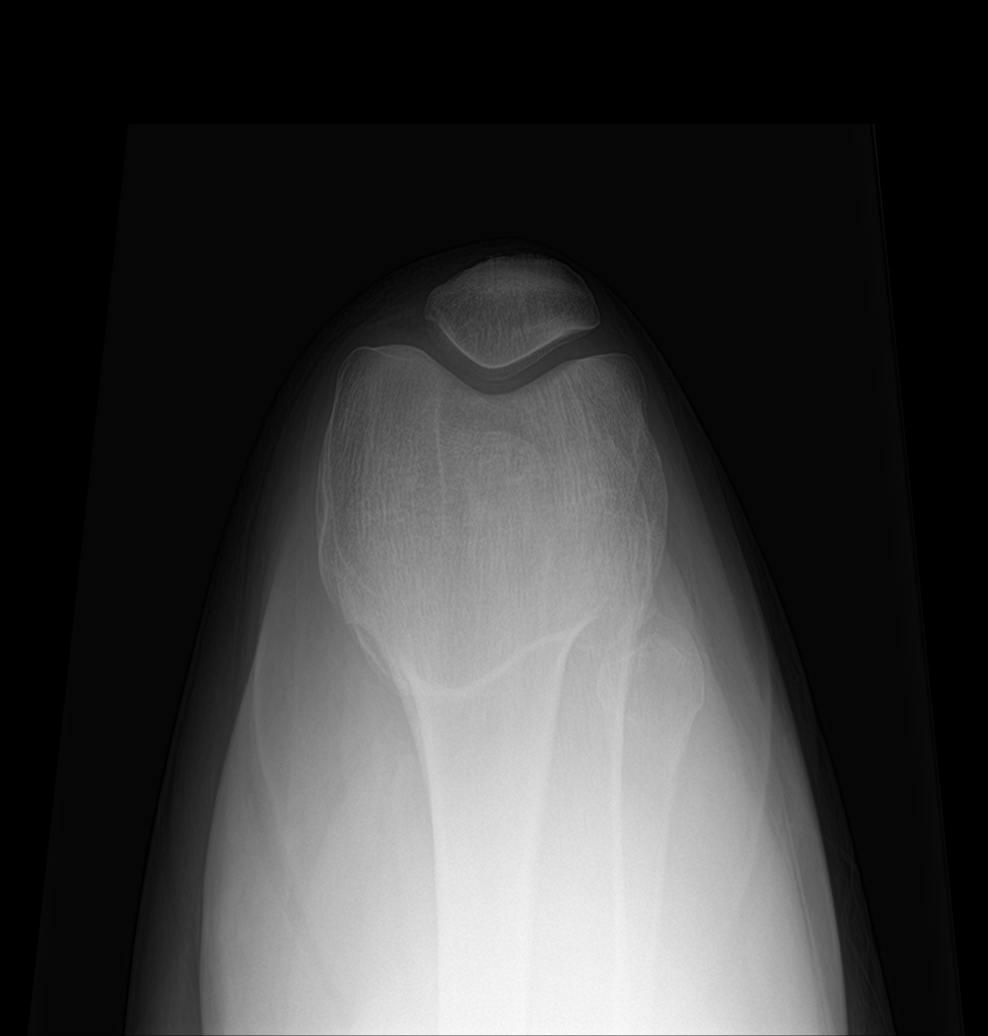

[knee ap bilat standing]
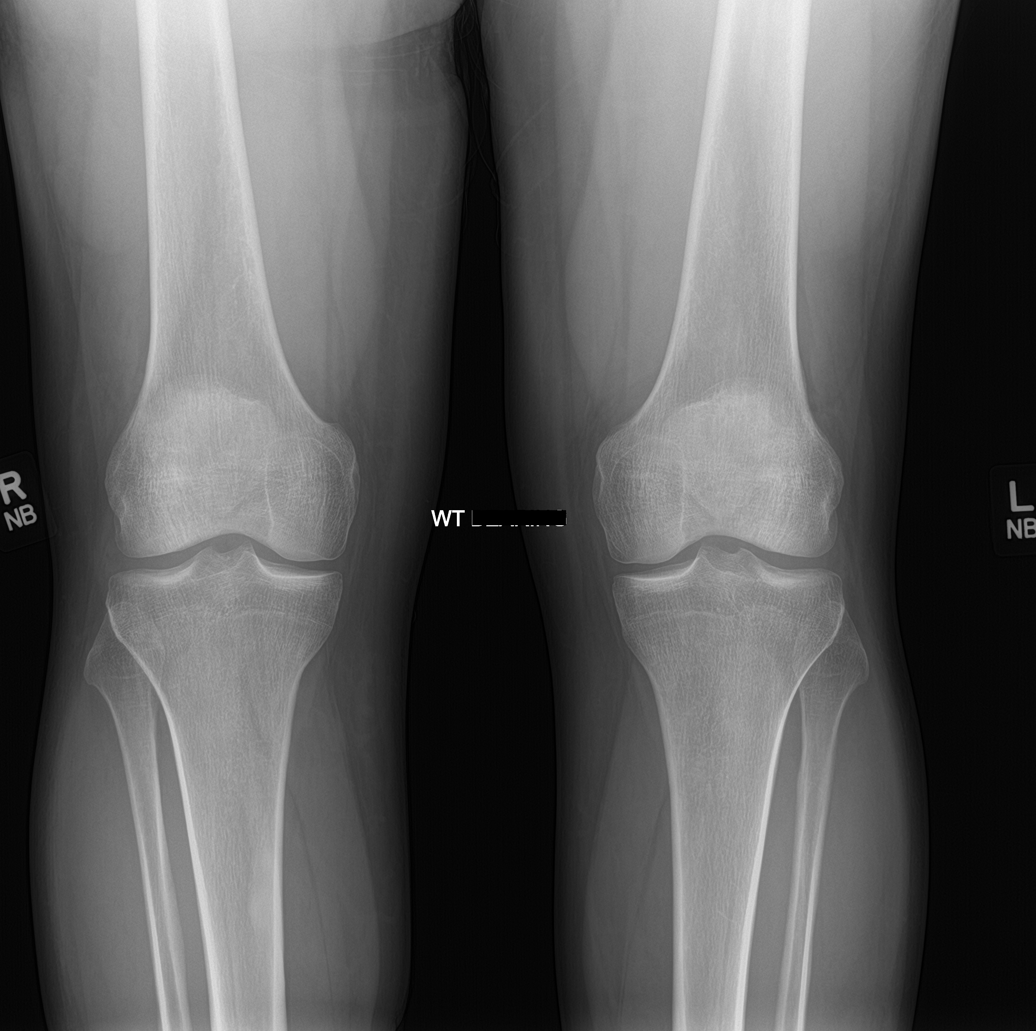

[4 of 4 positions shown; findings below may reference images not displayed]

FINDINGS: Standing frontal, standing tunnel, standing lateral, and sunrise
patellar images were obtained. No fracture or dislocation. No joint
effusion. There is a minimal spur along the anterior superior
patella. Joint spaces appear unremarkable. No erosive change.
IMPRESSION: No fracture, dislocation, or joint effusion. No appreciable joint
space narrowing. Small spur along the anterior superior patella may
indicate mild distal quadriceps tendinosis.
